# Patient Record
Sex: Female | Born: 1955 | Race: White | Hispanic: No | Marital: Married | Smoking: Never smoker
Health system: Southern US, Community
[De-identification: ages and names within clinical notes are randomized; demographics above are authoritative.]

## PROBLEM LIST (undated history)

## (undated) DIAGNOSIS — E785 Hyperlipidemia, unspecified: Secondary | ICD-10-CM

## (undated) DIAGNOSIS — I10 Essential (primary) hypertension: Secondary | ICD-10-CM

## (undated) HISTORY — PX: ABDOMINAL HYSTERECTOMY: SHX81

## (undated) HISTORY — PX: TONSILLECTOMY: SUR1361

## (undated) HISTORY — DX: Essential (primary) hypertension: I10

## (undated) HISTORY — DX: Hyperlipidemia, unspecified: E78.5

---

## 2013-03-26 ENCOUNTER — Ambulatory Visit (INDEPENDENT_AMBULATORY_CARE_PROVIDER_SITE_OTHER): Payer: BC Managed Care – PPO | Admitting: Family Medicine

## 2013-03-26 ENCOUNTER — Telehealth: Payer: Self-pay | Admitting: *Deleted

## 2013-03-26 ENCOUNTER — Encounter: Payer: Self-pay | Admitting: Family Medicine

## 2013-03-26 VITALS — BP 202/112 | HR 77 | Ht 63.75 in | Wt 145.0 lb

## 2013-03-26 DIAGNOSIS — H519 Unspecified disorder of binocular movement: Secondary | ICD-10-CM

## 2013-03-26 DIAGNOSIS — E785 Hyperlipidemia, unspecified: Secondary | ICD-10-CM

## 2013-03-26 DIAGNOSIS — I10 Essential (primary) hypertension: Secondary | ICD-10-CM | POA: Insufficient documentation

## 2013-03-26 DIAGNOSIS — H05829 Myopathy of extraocular muscles, unspecified orbit: Secondary | ICD-10-CM | POA: Insufficient documentation

## 2013-03-26 DIAGNOSIS — H05821 Myopathy of extraocular muscles, right orbit: Secondary | ICD-10-CM

## 2013-03-26 MED ORDER — LOSARTAN POTASSIUM 50 MG PO TABS: 50.0000 mg | ORAL_TABLET | Freq: Every day | ORAL | Status: DC

## 2013-03-26 NOTE — Telephone Encounter (Signed)
Pt called and left message stating her chol lab values for June of this year: Triglycerides 98, HDL 63, LDL 170, Total Chol 253

## 2013-03-26 NOTE — Progress Notes (Signed)
CC: Christie Lopez is a 57 y.o. female is here for Establish Care and discuss chol meds   Subjective: HPI:  Pleasant 57 year old here to establish care  Patient reports a history of hyperlipidemia and has been told by her former physician to start cholesterol medication. She is reluctant to do this as she believes her numbers are quite favorable. She would like a second opinion from our clinic. Unfortunately she does not have her numbers with her at this time. She has never been on cholesterol medication before. She tries to stay active no formal exercise routine she does watch what she eats.   Patient reports a history of hypertension: She takes her blood pressures at home and notes it is typically 180/100 average every morning. Interestingly in the afternoon she reports average 1:30 over 80s. She has been on amlodipine which helped control blood pressures but caused intolerable fatigue. She's been on losartan an unknown dose which helped somewhat but she still had stage II values per her report. She is unsure about daily salt intake. She denies stimulant use decongestant use nor caffeine consumption. She rarely drinks alcohol no tobacco use.  She denies recent or remote history of motor or sensory disturbances, chest pain, shortness of breath, headaches.   Review Of Systems Outlined In HPI  Past Medical History  Diagnosis Date  . Hypertension   . Hyperlipidemia      Family History  Problem Relation Age of Onset  . Hyperlipidemia    . Hypertension       History  Substance Use Topics  . Smoking status: Never Smoker   . Smokeless tobacco: Not on file  . Alcohol Use: 1.0 oz/week    2 drink(s) per week     Objective: Filed Vitals:   03/26/13 0836  BP: 202/112  Pulse: 77    General: Alert and Oriented, No Acute Distress HEENT: Pupils equal, round, reactive to light. Conjunctivae clear. Pink inferior turbinates.  Moist mucous membranes, pharynx without inflammation nor lesions.   Neck supple without palpable lymphadenopathy nor abnormal masses. Lungs: Clear to auscultation bilaterally, no wheezing/ronchi/rales.  Comfortable work of breathing. Good air movement. Cardiac: Regular rate and rhythm. Normal S1/S2.  No murmurs, rubs, nor gallops.   Neuro: CN II-XII grossly intact, full strength/rom of all four extremities, C5/L4/S1 DTRs 2/4 bilaterally, gait normal, rapid alternating movements normal, heel-shin test normal, Rhomberg normal. Extremities: No peripheral edema.  Strong peripheral pulses.  Mental Status: No depression, anxiety, nor agitation. Skin: Warm and dry.  Assessment & Plan: Jamilyn was seen today for establish care and discuss chol meds.  Diagnoses and associated orders for this visit:  Essential hypertension, benign - losartan (COZAAR) 50 MG tablet; Take 1 tablet (50 mg total) by mouth daily.  Hyperlipidemia  Extraocular muscle weakness, right  Other Orders - Fluticasone-Salmeterol (ADVAIR DISKUS IN); Inhale into the lungs. - DiphenhydrAMINE HCl (BENADRYL ALLERGY PO); Take by mouth.    Essential hypertension: Uncontrolled chronic condition we will restart losartan I've asked her to start this every evening she has agreed to keep a blood pressure diary taken twice a day starting today and through the beginning of losartan regimen. I have discussed that she may need multiple agents but we will slowly lower her blood pressure. Hyperlipidemia: She is agreed to provide [place] with recent lipid panel values from June for determination of her goal LDL We will hold off on blood work today as it sounds like she has had a recent TSH CBC and CMP, were  waiting outside records  Return in about 2 weeks (around 04/09/2013).

## 2013-03-27 ENCOUNTER — Encounter: Payer: Self-pay | Admitting: Family Medicine

## 2013-03-27 DIAGNOSIS — E785 Hyperlipidemia, unspecified: Secondary | ICD-10-CM | POA: Insufficient documentation

## 2013-03-27 DIAGNOSIS — M8580 Other specified disorders of bone density and structure, unspecified site: Secondary | ICD-10-CM | POA: Insufficient documentation

## 2013-03-27 DIAGNOSIS — K5790 Diverticulosis of intestine, part unspecified, without perforation or abscess without bleeding: Secondary | ICD-10-CM | POA: Insufficient documentation

## 2013-03-27 DIAGNOSIS — Z8709 Personal history of other diseases of the respiratory system: Secondary | ICD-10-CM | POA: Insufficient documentation

## 2013-03-27 MED ORDER — ATORVASTATIN CALCIUM 20 MG PO TABS: 20.0000 mg | ORAL_TABLET | Freq: Every day | ORAL | Status: DC

## 2013-03-27 NOTE — Telephone Encounter (Signed)
Sue Lush, Will you please let Christie Lopez know that based on her numbers it's estimated that she has a 6% risk of having a heart attack in the next ten years.  Current guidelines would put her goal LDL choleserol at less than 160.  With her's being just above that, I would encourage her to start a cholesterol medication such as Lipitor.  I will send this into her pharmacy if she's interested in starting it. It would be wise to recheck this in 3 months.

## 2013-03-27 NOTE — Telephone Encounter (Signed)
Pt.notified

## 2013-04-01 ENCOUNTER — Telehealth: Payer: Self-pay | Admitting: *Deleted

## 2013-04-01 DIAGNOSIS — E785 Hyperlipidemia, unspecified: Secondary | ICD-10-CM

## 2013-04-01 NOTE — Telephone Encounter (Signed)
Pt left a message stating she decided not to go on chol med

## 2013-04-06 ENCOUNTER — Encounter: Payer: Self-pay | Admitting: Family Medicine

## 2013-04-07 ENCOUNTER — Telehealth: Payer: Self-pay

## 2013-04-16 ENCOUNTER — Ambulatory Visit: Payer: BC Managed Care – PPO | Admitting: Family Medicine

## 2013-07-02 ENCOUNTER — Other Ambulatory Visit (HOSPITAL_COMMUNITY)
Admission: RE | Admit: 2013-07-02 | Discharge: 2013-07-02 | Disposition: A | Discharge status: Home / Self Care | Payer: BC Managed Care – PPO | Source: Ambulatory Visit | Attending: Family Medicine | Admitting: Family Medicine

## 2013-07-02 ENCOUNTER — Ambulatory Visit (INDEPENDENT_AMBULATORY_CARE_PROVIDER_SITE_OTHER): Payer: BC Managed Care – PPO | Admitting: Family Medicine

## 2013-07-02 ENCOUNTER — Encounter: Payer: Self-pay | Admitting: Family Medicine

## 2013-07-02 VITALS — BP 183/94 | HR 69 | Ht 63.75 in | Wt 144.0 lb

## 2013-07-02 DIAGNOSIS — Z0000 Encounter for general adult medical examination without abnormal findings: Secondary | ICD-10-CM

## 2013-07-02 DIAGNOSIS — M899 Disorder of bone, unspecified: Secondary | ICD-10-CM

## 2013-07-02 DIAGNOSIS — I10 Essential (primary) hypertension: Secondary | ICD-10-CM

## 2013-07-02 DIAGNOSIS — Z01419 Encounter for gynecological examination (general) (routine) without abnormal findings: Secondary | ICD-10-CM | POA: Insufficient documentation

## 2013-07-02 DIAGNOSIS — M8580 Other specified disorders of bone density and structure, unspecified site: Secondary | ICD-10-CM

## 2013-07-02 DIAGNOSIS — Z13220 Encounter for screening for lipoid disorders: Secondary | ICD-10-CM

## 2013-07-02 DIAGNOSIS — Z1151 Encounter for screening for human papillomavirus (HPV): Secondary | ICD-10-CM | POA: Insufficient documentation

## 2013-07-02 DIAGNOSIS — Z131 Encounter for screening for diabetes mellitus: Secondary | ICD-10-CM

## 2013-07-02 LAB — BASIC METABOLIC PANEL WITH GFR
Calcium: 10.1 mg/dL (ref 8.4–10.5)
GFR, Est African American: >89 mL/min
Glucose, Bld: 81 mg/dL (ref 70–99)
Sodium: 137 mEq/L (ref 135–145)

## 2013-07-02 LAB — LIPID PANEL
HDL: 60 mg/dL (ref >39)
LDL Cholesterol: 156 mg/dL — ABNORMAL HIGH (ref 0–99)
Total CHOL/HDL Ratio: 4 Ratio
VLDL: 23 mg/dL (ref 0–40)

## 2013-07-02 MED ORDER — LOSARTAN POTASSIUM 100 MG PO TABS: 100.0000 mg | ORAL_TABLET | Freq: Every day | ORAL | Status: DC

## 2013-07-02 NOTE — Progress Notes (Signed)
CC: Christie Lopez is a 57 y.o. female is here for Annual Exam   Subjective: HPI:  Colonoscopy: due 2017 Papsmear: Hysterectomy for prolapse, has never had abnormal Pap smear most recently one year ago she is unsure whether or not she has a cervix Mammogram: Had in jan 2014  DEXA: 06/2011 osteopenia will repeat  Influenza Vaccine: declined Pneumovax: denied despite asthma Td/Tdap: UTD Zoster: Start 57 yo  No acute complaints today she brings in a blood pressure diary showing 90% of the time blood pressures in normotensive or prehypertensive state.   Over the past 2 weeks have you been bothered by: - Little interest or pleasure in doing things: No - Feeling down depressed or hopeless: No  No alcohol, tobacco or recreational drug use. No formal exercise routine. Eats a varied diet with fruits and veggies  Review of Systems - General ROS: negative for - chills, fever, night sweats, weight gain or weight loss Ophthalmic ROS: negative for - decreased vision Psychological ROS: negative for - anxiety or depression ENT ROS: negative for - hearing change, nasal congestion, tinnitus or allergies Hematological and Lymphatic ROS: negative for - bleeding problems, bruising or swollen lymph nodes Breast ROS: negative Respiratory ROS: no cough, shortness of breath, or wheezing Cardiovascular ROS: no chest pain or dyspnea on exertion Gastrointestinal ROS: no abdominal pain, change in bowel habits, or black or bloody stools Genito-Urinary ROS: negative for - genital discharge, genital ulcers, incontinence or abnormal bleeding from genitals Musculoskeletal ROS: negative for - joint pain or muscle pain Neurological ROS: negative for - headaches or memory loss Dermatological ROS: negative for lumps, mole changes, rash and skin lesion changes  Past Medical History  Diagnosis Date  . Hypertension   . Hyperlipidemia      Family History  Problem Relation Age of Onset  . Hyperlipidemia    .  Hypertension       History  Substance Use Topics  . Smoking status: Never Smoker   . Smokeless tobacco: Not on file  . Alcohol Use: 1.0 oz/week    2 drink(s) per week     Objective: Filed Vitals:   07/02/13 0832  BP: 183/94  Pulse: 69    General: No Acute Distress HEENT: Atraumatic, normocephalic, conjunctivae normal without scleral icterus.  No nasal discharge, hearing grossly intact, TMs with good landmarks bilaterally with no middle ear abnormalities, posterior pharynx clear without oral lesions. Neck: Supple, trachea midline, no cervical nor supraclavicular adenopathy. Pulmonary: Clear to auscultation bilaterally without wheezing, rhonchi, nor rales. Cardiac: Regular rate and rhythm.  No murmurs, rubs, nor gallops. No peripheral edema.  2+ peripheral pulses bilaterally. Abdomen: Bowel sounds normal.  No masses.  Non-tender without rebound.  Negative Murphy's sign. GU: *Labia majora & minora without lesions.  Distal urethra unremarkable.  Vaginal wall integrity preserved without mucosal lesions.  Cervix is absent vaginal cuff unremarkable.  No adnexal tenderness or masses.  Appropriate uterine size for age. MSK: Grossly intact, no signs of weakness.  Full strength throughout upper and lower extremities.  Full ROM in upper and lower extremities.  No midline spinal tenderness. Neuro: Gait unremarkable, CN II-XII grossly intact.  C5-C6 Reflex 2/4 Bilaterally, L4 Reflex 2/4 Bilaterally.  Cerebellar function intact. Skin: No rashes. Psych: Alert and oriented to person/place/time.  Thought process normal. No anxiety/depression.   Assessment & Plan: [name] was seen today for annual exam.  Diagnoses and associated orders for this visit:  Annual physical exam - Cytology - PAP  Screening, lipid - Lipid  panel  Diabetes mellitus screening - BASIC METABOLIC PANEL WITH GFR  Osteopenia - DG Bone Density; Future  Essential hypertension, benign - losartan (COZAAR) 100 MG tablet;  Take 1 tablet (100 mg total) by mouth daily.    Healthy lifestyle interventions including but limited to regular exercise, a healthy low fat diet, moderation of salt intake, the dangers of tobacco/alcohol/recreational drug use, nutrition supplementation, and accident avoidance were discussed with the patient and a handout was provided for future reference. Obtained Pap, increasing losartan continued blood pressure diary at home. Due for screening lipid and diabetic screening.  Return in about 4 weeks (around 07/30/2013) for BP Check/Review.

## 2013-07-02 NOTE — Patient Instructions (Signed)
Dr. Temple Sporer's General Advice Following Your Complete Physical Exam  The Benefits of Regular Exercise: Unless you suffer from an uncontrolled cardiovascular condition, studies strongly suggest that regular exercise and physical activity will add to both the quality and length of your life.  The World Health Organization recommends 150 minutes of moderate intensity aerobic activity every week.  This is best split over 3-4 days a week, and can be as simple as a brisk walk for just over 35 minutes "most days of the week".  This type of exercise has been shown to lower LDL-Cholesterol, lower average blood sugars, lower blood pressure, lower cardiovascular disease risk, improve memory, and increase one's overall sense of wellbeing.  The addition of anaerobic (or "strength training") exercises offers additional benefits including but not limited to increased metabolism, prevention of osteoporosis, and improved overall cholesterol levels.  How Can I Strive For A Low-Fat Diet?: Current guidelines recommend that 25-35 percent of your daily energy (food) intake should come from fats.  One might ask how can this be achieved without having to dissect each meal on a daily basis?  Switch to skim or 1% milk instead of whole milk.  Focus on lean meats such as ground turkey, fresh fish, baked chicken, and lean cuts of beef as your source of dietary protein.  Consume less than 300mg/day of dietary cholesterol.  Limit trans fatty acid consumption primarily by limiting synthetic trans fats such as partially hydrogenated oils (Ex: fried fast foods).  Focus efforts on reducing your intake of "solid" fats (Ex: Butter).  Substitute olive or vegetable oil for solid fats where possible.  Moderation of Salt Intake: Provided you don't carry a diagnosis of congestive heart failure nor renal failure, I recommend a daily allowance of no more than 2300 mg of salt (sodium).  Keeping under this daily goal is associated with a  decreased risk of cardiovascular events, creeping above it can lead to elevated blood pressures and increases your risk of cardiovascular events.  Milligrams (mg) of salt is listed on all nutrition labels, and your daily intake can add up faster than you think.  Most canned and frozen dinners can pack in over half your daily salt allowance in one meal.    Lifestyle Health Risks: Certain lifestyle choices carry specific health risks.  As you may already know, tobacco use has been associated with increasing one's risk of cardiovascular disease, pulmonary disease, numerous cancers, among many other issues.  What you may not know is that there are medications and nicotine replacement strategies that can more than double your chances of successfully quitting.  I would be thrilled to help manage your quitting strategy if you currently use tobacco products.  When it comes to alcohol use, I've yet to find an "ideal" daily allowance.  Provided an individual does not have a medical condition that is exacerbated by alcohol consumption, general guidelines determine "safe drinking" as no more than two standard drinks for a man or no more than one standard drink for a female per day.  However, much debate still exists on whether any amount of alcohol consumption is technically "safe".  My general advice, keep alcohol consumption to a minimum for general health promotion.  If you or others believe that alcohol, tobacco, or recreational drug use is interfering with your life, I would be happy to provide confidential counseling regarding treatment options.  General "Over The Counter" Nutrition Advice: Postmenopausal women should aim for a daily calcium intake of 1200 mg, however a significant   portion of this might already be provided by diets including milk, yogurt, cheese, and other dairy products.  Vitamin D has been shown to help preserve bone density, prevent fatigue, and has even been shown to help reduce falls in the  elderly.  Ensuring a daily intake of 800 Units of Vitamin D is a good place to start to enjoy the above benefits, we can easily check your Vitamin D level to see if you'd potentially benefit from supplementation beyond 800 Units a day.  Folic Acid intake should be of particular concern to women of childbearing age.  Daily consumption of 400-800 mcg of Folic Acid is recommended to minimize the chance of spinal cord defects in a fetus should pregnancy occur.    For many adults, accidents still remain one of the most common culprits when it comes to cause of death.  Some of the simplest but most effective preventitive habits you can adopt include regular seatbelt use, proper helmet use, securing firearms, and regularly testing your smoke and carbon monoxide detectors.  Christie Lopez B. [facility] DO Med [facility] [address], [address] [place], [place] [address] Phone: [phone]  Breast Self-Examination You should begin examining your breasts at age 57 even though the risk for breast cancer is low in this age group. It is important to become familiar with how your breasts look and feel. This is true for pregnant women, nursing mothers, women in menopause and women who have breast implants.  Women should examine their breasts once a month to look for changes and lumps. By doing monthly breast exams, you get to know how your breasts feel and how they can change from month to month. This allows you to pick up changes early. It can also offer you some reassurance that your breast health is good. This exam only takes minutes. Most breast lumps are not caused by cancer. If you find a lump, a special x-ray called a mammogram, or other tests may be needed to determine what is wrong.  Some of the signs that a breast lump is caused by cancer include:  Dimpling of the skin or changes in the shape of the breast or nipple.   A dark-colored or bloody discharge from the nipple.   Swollen lymph glands around the  breast or in the armpit.   Redness of the breast or nipple.   Scaly nipple or skin on the breast.   Pain or swelling of the breast.  SELF-EXAM There are a few points to follow when doing a thorough breast exam. The best time to examine your breasts is 5 to 7 days after the menstrual period is over. During menstruation, the breasts are lumpier, and it may be more difficult to pick up changes. If you do not menstruate, have reached menopause or had a hysterectomy, examine your breasts the first day of every month. After three to four months, you will become more familiar with the variations of your breasts and more comfortable with the exam.  Perform your breast exam monthly. Keep a written record with breast changes or normal findings for each breast. This makes it easier to be sure of changes and to not solely depend on memory for size, tenderness, or location. Try to do the exam at the same time each month, and write down where you are in your menstrual cycle if you are still menstruating.   Look at your breasts. Stand in front of a mirror with your hands clasped behind your head. Tighten  your chest muscles and look for asymmetry. This means a difference in shape or contour from one breast to the other, such as puckers, dips or bumps. Look also for skin changes.   Lean forward with your hands on your hips. Again, look for symmetry and skin changes.   While showering, soap the breasts, and carefully feel the breasts with fingertips while holding the arm (on the side of the breast being examined) over the head. Do this with each breast carefully feeling for lumps or changes. Typically, a circular motion with moderate fingertip pressure should be used.   Repeat this exam while lying on your back, again with your arm behind your head and a pillow under your shoulders. Again, use your fingertips to examine both breasts, feeling for lumps and thickening. Begin at 1 o'clock and go clockwise around the  whole breast.   At the end of your exam, gently squeeze each nipple to see if there is any drainage. Look for nipple changes, dimpling or redness.   Lastly, examine the upper chest and clavicle areas and in your armpits.  It is not necessary to become alarmed if you find a breast lump. Most of them are not cancerous. However, it is necessary to see your caregiver if a lump is found in order to have it looked at. Document Released: 10/04/2004 Document Revised: 05/09/2011 Document Reviewed: 12/14/2008 [facility] Patient Information 2012 [place], [place].

## 2013-07-03 ENCOUNTER — Encounter: Payer: Self-pay | Admitting: Family Medicine

## 2013-07-16 ENCOUNTER — Other Ambulatory Visit: Payer: Self-pay

## 2013-08-12 ENCOUNTER — Ambulatory Visit (INDEPENDENT_AMBULATORY_CARE_PROVIDER_SITE_OTHER): Payer: BC Managed Care – PPO | Admitting: Family Medicine

## 2013-08-12 ENCOUNTER — Encounter: Payer: Self-pay | Admitting: Family Medicine

## 2013-08-12 VITALS — BP 161/94 | HR 67 | Wt 148.0 lb

## 2013-08-12 DIAGNOSIS — I10 Essential (primary) hypertension: Secondary | ICD-10-CM

## 2013-08-12 DIAGNOSIS — J45909 Unspecified asthma, uncomplicated: Secondary | ICD-10-CM

## 2013-08-12 MED ORDER — FLUTICASONE-SALMETEROL 100-50 MCG/DOSE IN AEPB: 1.0000 | INHALATION_SPRAY | Freq: Two times a day (BID) | RESPIRATORY_TRACT | Status: DC

## 2013-08-12 MED ORDER — LOSARTAN POTASSIUM-HCTZ 100-25 MG PO TABS: 1.0000 | ORAL_TABLET | Freq: Every day | ORAL | Status: DC

## 2013-08-12 NOTE — Progress Notes (Signed)
CC: Christie Lopez is a 57 y.o. female is here for Hypertension   Subjective: HPI:  Followup hypertension: Has been on losartan 100 mg taking daily basis for the past month. Blood pressures at home are in the stage I hypertensive region during the a.m. hours and are normotensive or prehypertensive predictably in the evening hours. This happens despite taking medication in the morning or afternoon. She denies known side effects, denies chest pain, shortness of breath, orthopnea, nor peripheral edema.  Followup asthma: Patient reports she is about to run out of the old prescription of Advair 100/50 she is using this on a daily basis without any need for rescue inhaler. Denies cough, shortness of breath or wheezing.  Review Of Systems Outlined In HPI  Past Medical History  Diagnosis Date  . Hypertension   . Hyperlipidemia      Family History  Problem Relation Age of Onset  . Hyperlipidemia    . Hypertension       History  Substance Use Topics  . Smoking status: Never Smoker   . Smokeless tobacco: Not on file  . Alcohol Use: 1.0 oz/week    2 drink(s) per week     Objective: Filed Vitals:   08/12/13 1430  BP: 161/94  Pulse: 67    General: Alert and Oriented, No Acute Distress HEENT: Pupils equal, round, reactive to light. Conjunctivae clear.  Moist mucous membranes pharynx unremarkable Lungs: Clear to auscultation bilaterally, no wheezing/ronchi/rales.  Comfortable work of breathing. Good air movement. Cardiac: Regular rate and rhythm. Normal S1/S2.  No murmurs, rubs, nor gallops.   Extremities: No peripheral edema.  Strong peripheral pulses.  Mental Status: No depression, anxiety, nor agitation. Skin: Warm and dry.  Assessment & Plan: Christie Lopez was seen today for hypertension.  Diagnoses and associated orders for this visit:  Asthma, chronic - Fluticasone-Salmeterol (ADVAIR DISKUS) 100-50 MCG/DOSE AEPB; Inhale 1 puff into the lungs 2 (two) times daily.  Essential  hypertension, benign - losartan-hydrochlorothiazide (HYZAAR) 100-25 MG per tablet; Take 1 tablet by mouth daily.    Asthma: Controlled, continue Advair Essential hypertension: Uncontrolled chronic condition adding hydrochlorothiazide. Keep home blood pressure diary and dropped off in the next 2-3 weeks to determine if she needs close followup face-to-face  Return if symptoms worsen or fail to improve.

## 2013-08-19 ENCOUNTER — Encounter: Payer: Self-pay | Admitting: Family Medicine

## 2013-08-20 ENCOUNTER — Encounter: Payer: Self-pay | Admitting: Family Medicine

## 2013-09-16 ENCOUNTER — Encounter: Payer: Self-pay | Admitting: Family Medicine

## 2013-09-21 ENCOUNTER — Telehealth: Payer: Self-pay | Admitting: Family Medicine

## 2013-09-21 MED ORDER — LOSARTAN POTASSIUM-HCTZ 50-12.5 MG PO TABS: 1.0000 | ORAL_TABLET | Freq: Every day | ORAL | Status: DC

## 2013-09-21 NOTE — Telephone Encounter (Signed)
Refill req 

## 2013-11-12 ENCOUNTER — Telehealth: Payer: Self-pay | Admitting: Family Medicine

## 2013-11-12 NOTE — Telephone Encounter (Signed)
Sue Lushndrea, Will you please let patient know that I'm not sure if [facility] let her know but her late Feb mammogram did not show any abnormality therefore only annual routine mammograms are recommended.

## 2013-11-12 NOTE — Telephone Encounter (Signed)
Pt.notified

## 2014-01-06 ENCOUNTER — Encounter: Payer: Self-pay | Admitting: Family Medicine

## 2014-04-07 ENCOUNTER — Other Ambulatory Visit: Payer: Self-pay | Admitting: Family Medicine

## 2014-07-14 ENCOUNTER — Ambulatory Visit (INDEPENDENT_AMBULATORY_CARE_PROVIDER_SITE_OTHER): Payer: BC Managed Care – PPO | Admitting: Family Medicine

## 2014-07-14 ENCOUNTER — Encounter: Payer: Self-pay | Admitting: Family Medicine

## 2014-07-14 VITALS — BP 130/85 | HR 61 | Ht 63.75 in | Wt 145.0 lb

## 2014-07-14 DIAGNOSIS — E785 Hyperlipidemia, unspecified: Secondary | ICD-10-CM | POA: Diagnosis not present

## 2014-07-14 DIAGNOSIS — Z0000 Encounter for general adult medical examination without abnormal findings: Secondary | ICD-10-CM | POA: Diagnosis not present

## 2014-07-14 DIAGNOSIS — M8580 Other specified disorders of bone density and structure, unspecified site: Secondary | ICD-10-CM | POA: Diagnosis not present

## 2014-07-14 DIAGNOSIS — I10 Essential (primary) hypertension: Secondary | ICD-10-CM

## 2014-07-14 DIAGNOSIS — J4530 Mild persistent asthma, uncomplicated: Secondary | ICD-10-CM

## 2014-07-14 LAB — LIPID PANEL
Cholesterol: 266 mg/dL — ABNORMAL HIGH (ref 0–200)
HDL: 62 mg/dL (ref >39)
LDL Cholesterol: 176 mg/dL — ABNORMAL HIGH (ref 0–99)
Total CHOL/HDL Ratio: 4.3 Ratio
Triglycerides: 138 mg/dL (ref <150)
VLDL: 28 mg/dL (ref 0–40)

## 2014-07-14 LAB — COMPLETE METABOLIC PANEL WITH GFR
ALBUMIN: 4.4 g/dL (ref 3.5–5.2)
ALT: 11 U/L (ref 0–35)
AST: 19 U/L (ref 0–37)
Alkaline Phosphatase: 60 U/L (ref 39–117)
BUN: 16 mg/dL (ref 6–23)
CO2: 26 meq/L (ref 19–32)
Calcium: 9.9 mg/dL (ref 8.4–10.5)
Chloride: 102 mEq/L (ref 96–112)
Creat: 0.93 mg/dL (ref 0.50–1.10)
GFR, EST AFRICAN AMERICAN: 78 mL/min
GFR, EST NON AFRICAN AMERICAN: 68 mL/min
Glucose, Bld: 86 mg/dL (ref 70–99)
POTASSIUM: 4 meq/L (ref 3.5–5.3)
SODIUM: 139 meq/L (ref 135–145)
TOTAL PROTEIN: 6.9 g/dL (ref 6.0–8.3)
Total Bilirubin: 1.1 mg/dL (ref 0.2–1.2)

## 2014-07-14 MED ORDER — FLUTICASONE-SALMETEROL 100-50 MCG/DOSE IN AEPB: 1.0000 | INHALATION_SPRAY | Freq: Two times a day (BID) | RESPIRATORY_TRACT | Status: DC

## 2014-07-14 MED ORDER — LOSARTAN POTASSIUM-HCTZ 50-12.5 MG PO TABS: ORAL_TABLET | ORAL | Status: DC

## 2014-07-14 NOTE — Progress Notes (Signed)
CC: Christie Lopez is a 58 y.o. female is here for Annual Exam   Subjective: HPI:  Colonoscopy:Due 2017 Papsmear: Negative last year.  Full hysterectomy for benign reasons, no further paps. Mammogram: Normal 10/2013, advised to have repeat February 2016  DEXA: Due for repeat, last 2012,we'll reorder today Influenza Vaccine: declined Pneumovax: declined despite asthma Td/Tdap: Td from 2013 UTD Zoster: (Start 58 yo)  Rare alcohol use no tobacco or recreational drug use  Review of Systems - General ROS: negative for - chills, fever, night sweats, weight gain or weight loss Ophthalmic ROS: negative for - decreased vision Psychological ROS: negative for - anxiety or depression ENT ROS: negative for - hearing change, nasal congestion, tinnitus or allergies Hematological and Lymphatic ROS: negative for - bleeding problems, bruising or swollen lymph nodes Breast ROS: negative Respiratory ROS: no cough, shortness of breath, or wheezing Cardiovascular ROS: no chest pain or dyspnea on exertion Gastrointestinal ROS: no abdominal pain, change in bowel habits, or black or bloody stools Genito-Urinary ROS: negative for - genital discharge, genital ulcers, incontinence or abnormal bleeding from genitals Musculoskeletal ROS: negative for - joint pain or muscle pain Neurological ROS: negative for - headaches or memory loss Dermatological ROS: negative for lumps, mole changes, rash and skin lesion changes  Past Medical History  Diagnosis Date  . Hypertension   . Hyperlipidemia     Past Surgical History  Procedure Laterality Date  . Tonsillectomy    . Abdominal hysterectomy     Family History  Problem Relation Age of Onset  . Hyperlipidemia    . Hypertension      History   Social History  . Marital Status: Married    Spouse Name: N/A    Number of Children: N/A  . Years of Education: N/A   Occupational History  . Not on file.   Social History Main Topics  . Smoking status: Never  Smoker   . Smokeless tobacco: Not on file  . Alcohol Use: 1.0 oz/week    2 drink(s) per week  . Drug Use: No  . Sexual Activity: Yes   Other Topics Concern  . Not on file   Social History Narrative     Objective: BP 130/85 mmHg  Pulse 61  Ht 5' 3.75" (1.619 m)  Wt 145 lb (65.772 kg)  BMI 25.09 kg/m2  General: No Acute Distress HEENT: Atraumatic, normocephalic, conjunctivae normal without scleral icterus.  No nasal discharge, hearing grossly intact, TMs with good landmarks bilaterally with no middle ear abnormalities, posterior pharynx clear without oral lesions. Neck: Supple, trachea midline, no cervical nor supraclavicular adenopathy. Pulmonary: Clear to auscultation bilaterally without wheezing, rhonchi, nor rales. Cardiac: Regular rate and rhythm.  No murmurs, rubs, nor gallops. No peripheral edema.  2+ peripheral pulses bilaterally. Abdomen: Bowel sounds normal.  No masses.  Non-tender without rebound.  Negative Murphy's sign. MSK: Grossly intact, no signs of weakness.  Full strength throughout upper and lower extremities.  Full ROM in upper and lower extremities.  No midline spinal tenderness. Neuro: Gait unremarkable, CN II-XII grossly intact.  C5-C6 Reflex 2/4 Bilaterally, L4 Reflex 2/4 Bilaterally.  Cerebellar function intact. Skin: No rashes. Scattered seborrheic keratoses on the arms and back, noninflamed Psych: Alert and oriented to person/place/time.  Thought process normal. No anxiety/depression.   Assessment & Plan: Christie Lopez was seen today for annual exam.  Diagnoses and associated orders for this visit:  Annual physical exam - COMPLETE METABOLIC PANEL WITH GFR - Lipid panel - DG Bone Density; Future  Essential hypertension, benign  Hyperlipidemia with target LDL less than 160  Osteopenia - DG Bone Density; Future  Asthma, chronic, mild persistent, uncomplicated - Fluticasone-Salmeterol (ADVAIR DISKUS) 100-50 MCG/DOSE AEPB; Inhale 1 puff into the lungs 2  (two) times daily.  Other Orders - losartan-hydrochlorothiazide (HYZAAR) 50-12.5 MG per tablet; TAKE ONE TABLET EVERY DAY    Healthy lifestyle interventions including but not limited to regular exercise, a healthy low fat diet, moderation of salt intake, the dangers of tobacco/alcohol/recreational drug use, nutrition supplementation, and accident avoidance were discussed with the patient and a handout was provided for future reference.  She requests refills on Hyzaar with her blood pressure be normotensive and control, refills on Advair with her asthma controlled.  Return in about 1 year (around 07/15/2015) for annual physicals.

## 2014-09-07 ENCOUNTER — Encounter: Payer: Self-pay | Admitting: Family Medicine

## 2014-09-07 ENCOUNTER — Ambulatory Visit (INDEPENDENT_AMBULATORY_CARE_PROVIDER_SITE_OTHER): Payer: BC Managed Care – PPO | Admitting: Family Medicine

## 2014-09-07 VITALS — BP 150/83 | HR 73 | Temp 98.5°F | Wt 151.0 lb

## 2014-09-07 DIAGNOSIS — A499 Bacterial infection, unspecified: Secondary | ICD-10-CM | POA: Diagnosis not present

## 2014-09-07 DIAGNOSIS — H1089 Other conjunctivitis: Secondary | ICD-10-CM

## 2014-09-07 DIAGNOSIS — H109 Unspecified conjunctivitis: Secondary | ICD-10-CM

## 2014-09-07 MED ORDER — POLYMYXIN B-TRIMETHOPRIM 10000-0.1 UNIT/ML-% OP SOLN: 2.0000 [drp] | OPHTHALMIC | Status: DC

## 2014-09-07 NOTE — Progress Notes (Signed)
CC: Christie Lopez is a 58 y.o. female is here for eye irritation   Subjective: HPI:  Right eye redness and drainage that has been present ever since she awoke this morning. She went to bed last night in her normal state of health with any known ocular complaints. Since the day has progressed she's noticed that the eyelids seem to be more swollen. She describes her discharge as clear tears with occasional film of mucus. No known exposure to anybody with similar illness.  She tells me that her eyelids feel irritated but denies any eye pain. She denies photophobia, change in vision, fevers, nor pain with blinking or movement of the eye. She's never had this before. She had what she thinks was a sinus infection and head cold last week but was recovering over the weekend. Currently denies nasal congestion and ear pain and facial pain or sinus pressure. No interventions for the above as of yet   Review Of Systems Outlined In HPI  Past Medical History  Diagnosis Date  . Hypertension   . Hyperlipidemia     Past Surgical History  Procedure Laterality Date  . Tonsillectomy    . Abdominal hysterectomy     Family History  Problem Relation Age of Onset  . Hyperlipidemia    . Hypertension      History   Social History  . Marital Status: Married    Spouse Name: N/A    Number of Children: N/A  . Years of Education: N/A   Occupational History  . Not on file.   Social History Main Topics  . Smoking status: Never Smoker   . Smokeless tobacco: Not on file  . Alcohol Use: 1.0 oz/week    2 drink(s) per week  . Drug Use: No  . Sexual Activity: Yes   Other Topics Concern  . Not on file   Social History Narrative     Objective: BP 150/83 mmHg  Pulse 73  Temp(Src) 98.5 F (36.9 C) (Oral)  Wt 151 lb (68.493 kg)  General: Alert and Oriented, No Acute Distress HEENT: Pupils equal, round, reactive to light. Left Conjunctivae clear. Moderate conjunctival injection/erythema involving the  majority of the conjunctiva and just barely sparing the border at the limbus. Mild mucoid discharge from the right eye. Anterior chamber of both eyes appear open without debris. No visible foreign body underneath the upper eyelid or lower eyelid on the right.  External ears unremarkable, canals clear with intact TMs with appropriate landmarks.  Middle ear appears open without effusion. Pink inferior turbinates.  Moist mucous membranes, pharynx without inflammation nor lesions.  Neck supple without palpable lymphadenopathy nor abnormal masses. No apparent discomfort with penlight to the eyes Extremities: No peripheral edema.  Strong peripheral pulses.  Mental Status: No depression, anxiety, nor agitation. Skin: Warm and dry.  Assessment & Plan: Christie Lopez was seen today for eye irritation.  Diagnoses and associated orders for this visit:  Bacterial conjunctivitis - trimethoprim-polymyxin b (POLYTRIM) ophthalmic solution; Place 2 drops into both eyes every 4 (four) hours. For seven days.    Discussed it's reassuring that she has 20/20 vision out of her right eye, it is also further reassuring that she is not having any photophobia, fever, nor ocular pain. I discussed that if she develops any of this she would need a emergent ophthalmology referral. For now start Polytrim for suspected bacterial conjunctivitis. Discussed contagious nature of this condition and ways to prevent spread.  Return if symptoms worsen or fail to improve.

## 2014-09-09 ENCOUNTER — Encounter: Payer: Self-pay | Admitting: Family Medicine

## 2014-09-22 ENCOUNTER — Ambulatory Visit (INDEPENDENT_AMBULATORY_CARE_PROVIDER_SITE_OTHER): Payer: BLUE CROSS/BLUE SHIELD

## 2014-09-22 DIAGNOSIS — M859 Disorder of bone density and structure, unspecified: Secondary | ICD-10-CM

## 2014-09-22 DIAGNOSIS — M8580 Other specified disorders of bone density and structure, unspecified site: Secondary | ICD-10-CM

## 2014-09-22 DIAGNOSIS — Z0000 Encounter for general adult medical examination without abnormal findings: Secondary | ICD-10-CM

## 2014-10-20 ENCOUNTER — Other Ambulatory Visit: Payer: Self-pay | Admitting: Family Medicine

## 2014-10-20 DIAGNOSIS — Z9289 Personal history of other medical treatment: Secondary | ICD-10-CM

## 2014-10-27 ENCOUNTER — Ambulatory Visit: Payer: BLUE CROSS/BLUE SHIELD

## 2014-11-03 ENCOUNTER — Ambulatory Visit (INDEPENDENT_AMBULATORY_CARE_PROVIDER_SITE_OTHER): Payer: BLUE CROSS/BLUE SHIELD

## 2014-11-03 DIAGNOSIS — Z9289 Personal history of other medical treatment: Secondary | ICD-10-CM

## 2014-11-03 DIAGNOSIS — R928 Other abnormal and inconclusive findings on diagnostic imaging of breast: Secondary | ICD-10-CM

## 2014-11-08 ENCOUNTER — Encounter: Payer: Self-pay | Admitting: Family Medicine

## 2014-11-08 ENCOUNTER — Other Ambulatory Visit: Payer: Self-pay | Admitting: Family Medicine

## 2014-11-08 DIAGNOSIS — R928 Other abnormal and inconclusive findings on diagnostic imaging of breast: Secondary | ICD-10-CM

## 2014-11-19 ENCOUNTER — Ambulatory Visit
Admission: RE | Admit: 2014-11-19 | Discharge: 2014-11-19 | Disposition: A | Discharge status: Home / Self Care | Payer: BLUE CROSS/BLUE SHIELD | Source: Ambulatory Visit | Attending: Family Medicine | Admitting: Family Medicine

## 2014-11-19 ENCOUNTER — Encounter: Payer: Self-pay | Admitting: Family Medicine

## 2014-11-19 DIAGNOSIS — R928 Other abnormal and inconclusive findings on diagnostic imaging of breast: Secondary | ICD-10-CM

## 2014-11-19 DIAGNOSIS — N63 Unspecified lump in unspecified breast: Secondary | ICD-10-CM | POA: Insufficient documentation

## 2015-02-28 ENCOUNTER — Encounter: Payer: Self-pay | Admitting: Family Medicine

## 2015-03-01 ENCOUNTER — Telehealth: Payer: Self-pay | Admitting: Family Medicine

## 2015-03-01 NOTE — Telephone Encounter (Signed)
Message left on vm 

## 2015-03-01 NOTE — Telephone Encounter (Signed)
Sue Lush, Will you please let patient know that her blood pressure does not look high enough to need to restart her losartan-hctz.  I think she can safely focus on a low sodium diet and exercise most days of the week to try and get her BP down.  Can she please f/u in 2-3 months to go over her success.  (BP after stopping losartan-hctz in pre-hypertensive range.

## 2015-04-15 ENCOUNTER — Ambulatory Visit: Payer: BLUE CROSS/BLUE SHIELD | Admitting: Family Medicine

## 2015-04-22 ENCOUNTER — Encounter: Payer: Self-pay | Admitting: Family Medicine

## 2015-04-22 ENCOUNTER — Ambulatory Visit (INDEPENDENT_AMBULATORY_CARE_PROVIDER_SITE_OTHER): Payer: BC Managed Care – PPO | Admitting: Family Medicine

## 2015-04-22 VITALS — BP 157/96 | HR 70 | Ht 63.75 in | Wt 140.0 lb

## 2015-04-22 DIAGNOSIS — I10 Essential (primary) hypertension: Secondary | ICD-10-CM

## 2015-04-22 DIAGNOSIS — Z8709 Personal history of other diseases of the respiratory system: Secondary | ICD-10-CM

## 2015-04-22 MED ORDER — CHLORTHALIDONE 50 MG PO TABS: 50.0000 mg | ORAL_TABLET | Freq: Every day | ORAL | Status: DC

## 2015-04-22 NOTE — Progress Notes (Signed)
CC: Christie Lopez is a 59 y.o. female is here for Blood Pressure Check   Subjective: HPI:  Follow-up essential hypertension: Currently not taking any medication for blood pressure. She's tried to reduce the sodium in her diet and believes that she's gotten as good as she can get. She's also been able to lose about 5 pounds since I saw her last. She denies chest pain shortness of breath orthopnea nor peripheral edema. She brings in blood pressures today from home from 2 separate machines showing readings 50% of the time in the prehypertensive state and 50% in stage I hypertension.   Follow-up asthma: Denies any shortness of breath wheezing or chest discomfort. Has not had to use albuterol for any months.  Review Of Systems Outlined In HPI  Past Medical History  Diagnosis Date  . Hypertension   . Hyperlipidemia     Past Surgical History  Procedure Laterality Date  . Tonsillectomy    . Abdominal hysterectomy     Family History  Problem Relation Age of Onset  . Hyperlipidemia    . Hypertension      Social History   Social History  . Marital Status: Married    Spouse Name: N/A  . Number of Children: N/A  . Years of Education: N/A   Occupational History  . Not on file.   Social History Main Topics  . Smoking status: Never Smoker   . Smokeless tobacco: Not on file  . Alcohol Use: 1.0 oz/week    2 drink(s) per week  . Drug Use: No  . Sexual Activity: Yes   Other Topics Concern  . Not on file   Social History Narrative     Objective: BP 157/96 mmHg  Pulse 70  Ht 5' 3.75" (1.619 m)  Wt 140 lb (63.504 kg)  BMI 24.23 kg/m2  Vital signs reviewed. General: Alert and Oriented, No Acute Distress HEENT: Pupils equal, round, reactive to light. Conjunctivae clear.  External ears unremarkable.  Moist mucous membranes. Lungs: Clear and comfortable work of breathing, speaking in full sentences without accessory muscle use. Cardiac: Regular rate and rhythm.  Neuro: CN II-XII  grossly intact, gait normal. Extremities: No peripheral edema.  Strong peripheral pulses.  Mental Status: No depression, anxiety, nor agitation. Logical though process. Skin: Warm and dry. Assessment & Plan: Christie Lopez was seen today for blood pressure check.  Diagnoses and all orders for this visit:  Essential hypertension, benign -     chlorthalidone (HYGROTON) 50 MG tablet; Take 1 tablet (50 mg total) by mouth daily.  History of asthma   Essential hypertension: Uncontrolled chronic condition starting chlorthalidone, keep blood pressure readings on a daily basis for the next 2 weeks and return for nurse visit with comparison between our machine and her home cuff. Asthma: Controlled no need for current interventions.   Return in about 4 weeks (around 05/20/2015) for Nurse visit, bring BP cuff and blood pressure readings.

## 2015-05-09 ENCOUNTER — Encounter: Payer: Self-pay | Admitting: Family Medicine

## 2015-05-09 DIAGNOSIS — I10 Essential (primary) hypertension: Secondary | ICD-10-CM

## 2015-05-23 ENCOUNTER — Other Ambulatory Visit: Payer: Self-pay | Admitting: Family Medicine

## 2015-05-23 DIAGNOSIS — R928 Other abnormal and inconclusive findings on diagnostic imaging of breast: Secondary | ICD-10-CM

## 2015-05-31 ENCOUNTER — Encounter: Payer: Self-pay | Admitting: Family Medicine

## 2015-05-31 DIAGNOSIS — I10 Essential (primary) hypertension: Secondary | ICD-10-CM

## 2015-05-31 MED ORDER — CHLORTHALIDONE 50 MG PO TABS: 50.0000 mg | ORAL_TABLET | Freq: Every day | ORAL | Status: DC

## 2015-07-04 ENCOUNTER — Ambulatory Visit
Admission: RE | Admit: 2015-07-04 | Discharge: 2015-07-04 | Disposition: A | Discharge status: Home / Self Care | Payer: BC Managed Care – PPO | Source: Ambulatory Visit | Attending: Family Medicine | Admitting: Family Medicine

## 2015-07-04 DIAGNOSIS — R928 Other abnormal and inconclusive findings on diagnostic imaging of breast: Secondary | ICD-10-CM

## 2015-07-15 ENCOUNTER — Telehealth: Payer: Self-pay

## 2015-07-15 DIAGNOSIS — Z0000 Encounter for general adult medical examination without abnormal findings: Secondary | ICD-10-CM

## 2015-07-15 NOTE — Telephone Encounter (Signed)
Pt will like to get labs done before her visit this upcoming Wednesday.

## 2015-07-15 NOTE — Telephone Encounter (Signed)
Lab slips in your in box, please ask patient to be fasting.

## 2015-07-15 NOTE — Telephone Encounter (Signed)
Pt advised.

## 2015-07-20 ENCOUNTER — Encounter: Payer: Self-pay | Admitting: Family Medicine

## 2015-07-20 ENCOUNTER — Ambulatory Visit (INDEPENDENT_AMBULATORY_CARE_PROVIDER_SITE_OTHER): Payer: BC Managed Care – PPO | Admitting: Family Medicine

## 2015-07-20 VITALS — BP 130/80 | HR 64 | Wt 141.0 lb

## 2015-07-20 DIAGNOSIS — N63 Unspecified lump in unspecified breast: Secondary | ICD-10-CM

## 2015-07-20 DIAGNOSIS — I10 Essential (primary) hypertension: Secondary | ICD-10-CM | POA: Diagnosis not present

## 2015-07-20 DIAGNOSIS — Z0000 Encounter for general adult medical examination without abnormal findings: Secondary | ICD-10-CM

## 2015-07-20 LAB — CBC
HEMATOCRIT: 37 % (ref 36.0–46.0)
HEMOGLOBIN: 13.1 g/dL (ref 12.0–15.0)
MCH: 29.8 pg (ref 26.0–34.0)
MCHC: 35.4 g/dL (ref 30.0–36.0)
MCV: 84.1 fL (ref 78.0–100.0)
MPV: 9.7 fL (ref 8.6–12.4)
Platelets: 239 10*3/uL (ref 150–400)
RBC: 4.4 MIL/uL (ref 3.87–5.11)
RDW: 13.6 % (ref 11.5–15.5)
WBC: 4.4 10*3/uL (ref 4.0–10.5)

## 2015-07-20 MED ORDER — CHLORTHALIDONE 50 MG PO TABS: 50.0000 mg | ORAL_TABLET | Freq: Every day | ORAL | Status: DC

## 2015-07-20 NOTE — Progress Notes (Signed)
CC: Christie Lopez is a 59 y.o. female is here for Annual Exam   Subjective: HPI:  Colonoscopy: Normal Sept 2016, repeat 2026 Papsmear: Not indicated Mammogram:Due Feb 2017  DEXA: Due one year  Influenza Vaccine:  Pneumovax: no current indication Td/Tdap: UTD from 2013 Zoster: (Start 59 yo)   Requesting complete physical exam no acute complaints  Review of Systems - General ROS: negative for - chills, fever, night sweats, weight gain or weight loss Ophthalmic ROS: negative for - decreased vision Psychological ROS: negative for - anxiety or depression ENT ROS: negative for - hearing change, nasal congestion, tinnitus or allergies Hematological and Lymphatic ROS: negative for - bleeding problems, bruising or swollen lymph nodes Breast ROS: negative Respiratory ROS: no cough, shortness of breath, or wheezing Cardiovascular ROS: no chest pain or dyspnea on exertion Gastrointestinal ROS: no abdominal pain, change in bowel habits, or black or bloody stools Genito-Urinary ROS: negative for - genital discharge, genital ulcers, incontinence or abnormal bleeding from genitals Musculoskeletal ROS: negative for - joint pain or muscle pain Neurological ROS: negative for - headaches or memory loss Dermatological ROS: negative for lumps, mole changes, rash and skin lesion changes  Past Medical History  Diagnosis Date  . Hypertension   . Hyperlipidemia     Past Surgical History  Procedure Laterality Date  . Tonsillectomy    . Abdominal hysterectomy     Family History  Problem Relation Age of Onset  . Hyperlipidemia    . Hypertension      Social History   Social History  . Marital Status: Married    Spouse Name: N/A  . Number of Children: N/A  . Years of Education: N/A   Occupational History  . Not on file.   Social History Main Topics  . Smoking status: Never Smoker   . Smokeless tobacco: Not on file  . Alcohol Use: 1.0 oz/week    2 drink(s) per week  . Drug Use: No  .  Sexual Activity: Yes   Other Topics Concern  . Not on file   Social History Narrative     Objective: BP 130/80 mmHg  Pulse 64  Wt 141 lb (63.957 kg)  General: No Acute Distress HEENT: Atraumatic, normocephalic, conjunctivae normal without scleral icterus.  No nasal discharge, hearing grossly intact, TMs with good landmarks bilaterally with no middle ear abnormalities, posterior pharynx clear without oral lesions. Neck: Supple, trachea midline, no cervical nor supraclavicular adenopathy. Pulmonary: Clear to auscultation bilaterally without wheezing, rhonchi, nor rales. Cardiac: Regular rate and rhythm.  No murmurs, rubs, nor gallops. No peripheral edema.  2+ peripheral pulses bilaterally. Abdomen: Bowel sounds normal.  No masses.  Non-tender without rebound.  Negative Murphy's sign. MSK: Grossly intact, no signs of weakness.  Full strength throughout upper and lower extremities.  Full ROM in upper and lower extremities.  No midline spinal tenderness. Neuro: Gait unremarkable, CN II-XII grossly intact.other than looking to the left and down with her left eye  C5-C6 Reflex 2/4 Bilaterally, L4 Reflex 2/4 Bilaterally.  Cerebellar function intact. Skin: No rashes. Psych: Alert and oriented to person/place/time.  Thought process normal. No anxiety/depression.   Assessment & Plan: [name] was seen today for annual exam.  Diagnoses and all orders for this visit:  Breast mass  Annual physical exam  Essential hypertension, benign -     chlorthalidone (HYGROTON) 50 MG tablet; Take 1 tablet (50 mg total) by mouth daily.   Healthy lifestyle interventions including but not limited to regular exercise, a  healthy low fat diet, moderation of salt intake, the dangers of tobacco/alcohol/recreational drug use, nutrition supplementation, and accident avoidance were discussed with the patient and a handout was provided for future reference.  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  Return in about 6 months  (around 01/17/2016) for Blood Pressure Follow Up, Call radiology in Feburary to schedule a "Screening Mammogram".

## 2015-07-20 NOTE — Patient Instructions (Signed)
Dr. Tran Arzuaga's General Advice Following Your Complete Physical Exam  The Benefits of Regular Exercise: Unless you suffer from an uncontrolled cardiovascular condition, studies strongly suggest that regular exercise and physical activity will add to both the quality and length of your life.  The World Health Organization recommends 150 minutes of moderate intensity aerobic activity every week.  This is best split over 3-4 days a week, and can be as simple as a brisk walk for just over 35 minutes "most days of the week".  This type of exercise has been shown to lower LDL-Cholesterol, lower average blood sugars, lower blood pressure, lower cardiovascular disease risk, improve memory, and increase one's overall sense of wellbeing.  The addition of anaerobic (or "strength training") exercises offers additional benefits including but not limited to increased metabolism, prevention of osteoporosis, and improved overall cholesterol levels.  How Can I Strive For A Low-Fat Diet?: Current guidelines recommend that 25-35 percent of your daily energy (food) intake should come from fats.  One might ask how can this be achieved without having to dissect each meal on a daily basis?  Switch to skim or 1% milk instead of whole milk.  Focus on lean meats such as ground turkey, fresh fish, baked chicken, and lean cuts of beef as your source of dietary protein.  Limit saturated fat consumption to less than 10% of your daily caloric intake.  Limit trans fatty acid consumption primarily by limiting synthetic trans fats such as partially hydrogenated oils (Ex: fried fast foods).  Substitute olive or vegetable oil for solid fats where possible.  Moderation of Salt Intake: Provided you don't carry a diagnosis of congestive heart failure nor renal failure, I recommend a daily allowance of no more than 2300 mg of salt (sodium).  Keeping under this daily goal is associated with a decreased risk of cardiovascular events, creeping  above it can lead to elevated blood pressures and increases your risk of cardiovascular events.  Milligrams (mg) of salt is listed on all nutrition labels, and your daily intake can add up faster than you think.  Most canned and frozen dinners can pack in over half your daily salt allowance in one meal.    Lifestyle Health Risks: Certain lifestyle choices carry specific health risks.  As you may already know, tobacco use has been associated with increasing one's risk of cardiovascular disease, pulmonary disease, numerous cancers, among many other issues.  What you may not know is that there are medications and nicotine replacement strategies that can more than double your chances of successfully quitting.  I would be thrilled to help manage your quitting strategy if you currently use tobacco products.  When it comes to alcohol use, I've yet to find an "ideal" daily allowance.  Provided an individual does not have a medical condition that is exacerbated by alcohol consumption, general guidelines determine "safe drinking" as no more than two standard drinks for a man or no more than one standard drink for a female per day.  However, much debate still exists on whether any amount of alcohol consumption is technically "safe".  My general advice, keep alcohol consumption to a minimum for general health promotion.  If you or others believe that alcohol, tobacco, or recreational drug use is interfering with your life, I would be happy to provide confidential counseling regarding treatment options.  General "Over The Counter" Nutrition Advice: Postmenopausal women should aim for a daily calcium intake of 1200 mg, however a significant portion of this might already be   provided by diets including milk, yogurt, cheese, and other dairy products.  Vitamin D has been shown to help preserve bone density, prevent fatigue, and has even been shown to help reduce falls in the elderly.  Ensuring a daily intake of 800 Units of  Vitamin D is a good place to start to enjoy the above benefits, we can easily check your Vitamin D level to see if you'd potentially benefit from supplementation beyond 800 Units a day.  Folic Acid intake should be of particular concern to women of childbearing age.  Daily consumption of 400-800 mcg of Folic Acid is recommended to minimize the chance of spinal cord defects in a fetus should pregnancy occur.    For many adults, accidents still remain one of the most common culprits when it comes to cause of death.  Some of the simplest but most effective preventitive habits you can adopt include regular seatbelt use, proper helmet use, securing firearms, and regularly testing your smoke and carbon monoxide detectors.  Christie Beale B. Ovadia Lopp DO [facility] [address], [address] [place], [place] [address] Phone: [phone]  

## 2015-07-21 LAB — COMPLETE METABOLIC PANEL WITH GFR
ALBUMIN: 4.3 g/dL (ref 3.6–5.1)
ALK PHOS: 49 U/L (ref 33–130)
ALT: 16 U/L (ref 6–29)
AST: 25 U/L (ref 10–35)
BUN: 13 mg/dL (ref 7–25)
CHLORIDE: 97 mmol/L — AB (ref 98–110)
CO2: 31 mmol/L (ref 20–31)
Calcium: 10.2 mg/dL (ref 8.6–10.4)
Creat: 0.91 mg/dL (ref 0.50–1.05)
GFR, EST NON AFRICAN AMERICAN: 69 mL/min (ref >=60)
GFR, Est African American: 80 mL/min (ref >=60)
GLUCOSE: 81 mg/dL (ref 65–99)
POTASSIUM: 3.5 mmol/L (ref 3.5–5.3)
SODIUM: 139 mmol/L (ref 135–146)
Total Bilirubin: 1.3 mg/dL — ABNORMAL HIGH (ref 0.2–1.2)
Total Protein: 6.8 g/dL (ref 6.1–8.1)

## 2015-07-21 LAB — LIPID PANEL
CHOL/HDL RATIO: 4.1 ratio (ref <=5.0)
CHOLESTEROL: 236 mg/dL — AB (ref 125–200)
HDL: 58 mg/dL (ref >=46)
LDL CALC: 144 mg/dL — AB (ref <130)
Triglycerides: 171 mg/dL — ABNORMAL HIGH (ref <150)
VLDL: 34 mg/dL — AB (ref <30)

## 2015-09-27 ENCOUNTER — Encounter: Payer: Self-pay | Admitting: Family Medicine

## 2015-09-28 MED ORDER — ZOSTER VACCINE LIVE 19400 UNT/0.65ML ~~LOC~~ SOLR: 0.6500 mL | Freq: Once | SUBCUTANEOUS | Status: AC

## 2015-10-04 ENCOUNTER — Encounter: Payer: Self-pay | Admitting: Family Medicine

## 2015-10-04 MED ORDER — FLUTICASONE-SALMETEROL 100-50 MCG/DOSE IN AEPB: 1.0000 | INHALATION_SPRAY | Freq: Two times a day (BID) | RESPIRATORY_TRACT | Status: AC

## 2016-03-20 ENCOUNTER — Other Ambulatory Visit: Payer: Self-pay | Admitting: Family Medicine

## 2016-04-02 ENCOUNTER — Other Ambulatory Visit: Payer: Self-pay | Admitting: Physician Assistant

## 2016-04-02 DIAGNOSIS — Z1231 Encounter for screening mammogram for malignant neoplasm of breast: Secondary | ICD-10-CM

## 2016-04-11 ENCOUNTER — Ambulatory Visit (INDEPENDENT_AMBULATORY_CARE_PROVIDER_SITE_OTHER): Payer: BC Managed Care – PPO

## 2016-04-11 DIAGNOSIS — Z1231 Encounter for screening mammogram for malignant neoplasm of breast: Secondary | ICD-10-CM

## 2016-07-19 ENCOUNTER — Other Ambulatory Visit: Payer: Self-pay

## 2016-07-19 DIAGNOSIS — Z130 Encounter for screening for diseases of the blood and blood-forming organs and certain disorders involving the immune mechanism: Secondary | ICD-10-CM

## 2016-07-19 DIAGNOSIS — M8580 Other specified disorders of bone density and structure, unspecified site: Secondary | ICD-10-CM

## 2016-07-19 DIAGNOSIS — Z1329 Encounter for screening for other suspected endocrine disorder: Secondary | ICD-10-CM

## 2016-07-19 DIAGNOSIS — E7849 Other hyperlipidemia: Secondary | ICD-10-CM

## 2016-07-19 DIAGNOSIS — I10 Essential (primary) hypertension: Secondary | ICD-10-CM

## 2016-07-19 DIAGNOSIS — Z0000 Encounter for general adult medical examination without abnormal findings: Secondary | ICD-10-CM

## 2016-07-19 LAB — COMPLETE METABOLIC PANEL WITH GFR
ALT: 12 U/L (ref 6–29)
AST: 21 U/L (ref 10–35)
Albumin: 4.5 g/dL (ref 3.6–5.1)
Alkaline Phosphatase: 46 U/L (ref 33–130)
BUN: 13 mg/dL (ref 7–25)
CHLORIDE: 98 mmol/L (ref 98–110)
CO2: 25 mmol/L (ref 20–31)
CREATININE: 1.05 mg/dL — AB (ref 0.50–0.99)
Calcium: 9.8 mg/dL (ref 8.6–10.4)
GFR, Est African American: 67 mL/min (ref >=60)
GFR, Est Non African American: 58 mL/min — ABNORMAL LOW (ref >=60)
Glucose, Bld: 90 mg/dL (ref 65–99)
Potassium: 3.3 mmol/L — ABNORMAL LOW (ref 3.5–5.3)
SODIUM: 136 mmol/L (ref 135–146)
Total Bilirubin: 1.3 mg/dL — ABNORMAL HIGH (ref 0.2–1.2)
Total Protein: 6.9 g/dL (ref 6.1–8.1)

## 2016-07-19 LAB — CBC WITH DIFFERENTIAL/PLATELET
BASOS PCT: 1 %
Basophils Absolute: 38 cells/uL (ref 0–200)
EOS ABS: 76 {cells}/uL (ref 15–500)
Eosinophils Relative: 2 %
HEMATOCRIT: 39.7 % (ref 35.0–45.0)
Hemoglobin: 13.9 g/dL (ref 11.7–15.5)
LYMPHS PCT: 28 %
Lymphs Abs: 1064 cells/uL (ref 850–3900)
MCH: 29.5 pg (ref 27.0–33.0)
MCHC: 35 g/dL (ref 32.0–36.0)
MCV: 84.3 fL (ref 80.0–100.0)
MONO ABS: 456 {cells}/uL (ref 200–950)
MPV: 10 fL (ref 7.5–12.5)
Monocytes Relative: 12 %
NEUTROS PCT: 57 %
Neutro Abs: 2166 cells/uL (ref 1500–7800)
Platelets: 259 10*3/uL (ref 140–400)
RBC: 4.71 MIL/uL (ref 3.80–5.10)
RDW: 13.2 % (ref 11.0–15.0)
WBC: 3.8 10*3/uL (ref 3.8–10.8)

## 2016-07-19 LAB — LIPID PANEL
Cholesterol: 257 mg/dL — ABNORMAL HIGH (ref <200)
HDL: 70 mg/dL (ref >50)
LDL CALC: 161 mg/dL — AB
Total CHOL/HDL Ratio: 3.7 Ratio (ref <5.0)
Triglycerides: 128 mg/dL (ref <150)
VLDL: 26 mg/dL (ref <30)

## 2016-07-19 LAB — TSH: TSH: 5.85 m[IU]/L — AB

## 2016-07-20 LAB — VITAMIN D 25 HYDROXY (VIT D DEFICIENCY, FRACTURES): Vit D, 25-Hydroxy: 20 ng/mL — ABNORMAL LOW (ref 30–100)

## 2016-07-23 ENCOUNTER — Ambulatory Visit (INDEPENDENT_AMBULATORY_CARE_PROVIDER_SITE_OTHER): Payer: BC Managed Care – PPO | Admitting: Physician Assistant

## 2016-07-23 ENCOUNTER — Other Ambulatory Visit: Payer: Self-pay | Admitting: *Deleted

## 2016-07-23 ENCOUNTER — Encounter: Payer: Self-pay | Admitting: Physician Assistant

## 2016-07-23 VITALS — BP 130/74 | HR 68 | Ht 63.75 in | Wt 137.0 lb

## 2016-07-23 DIAGNOSIS — E876 Hypokalemia: Secondary | ICD-10-CM | POA: Diagnosis not present

## 2016-07-23 DIAGNOSIS — Z0000 Encounter for general adult medical examination without abnormal findings: Secondary | ICD-10-CM

## 2016-07-23 DIAGNOSIS — E039 Hypothyroidism, unspecified: Secondary | ICD-10-CM

## 2016-07-23 DIAGNOSIS — R7989 Other specified abnormal findings of blood chemistry: Secondary | ICD-10-CM

## 2016-07-23 DIAGNOSIS — I10 Essential (primary) hypertension: Secondary | ICD-10-CM

## 2016-07-23 MED ORDER — CHLORTHALIDONE 50 MG PO TABS: 50.0000 mg | ORAL_TABLET | Freq: Every day | ORAL | 3 refills | Status: DC

## 2016-07-23 MED ORDER — LEVOTHYROXINE SODIUM 25 MCG PO TABS: 25.0000 ug | ORAL_TABLET | Freq: Every day | ORAL | 1 refills | Status: DC

## 2016-07-23 MED ORDER — CHLORTHALIDONE 50 MG PO TABS: 50.0000 mg | ORAL_TABLET | Freq: Every day | ORAL | 3 refills | Status: AC

## 2016-07-23 NOTE — Progress Notes (Signed)
Subjective:     Christie Lopez is a 60 y.o. female and is here for a comprehensive physical exam. The patient reports no problems.  Social History   Social History  . Marital status: Married    Spouse name: N/A  . Number of children: N/A  . Years of education: N/A   Occupational History  . Not on file.   Social History Main Topics  . Smoking status: Never Smoker  . Smokeless tobacco: Not on file  . Alcohol use 1.0 oz/week    2 drink(s) per week  . Drug use: No  . Sexual activity: Yes   Other Topics Concern  . Not on file   Social History Narrative  . No narrative on file   Health Maintenance  Topic Date Due  . Hepatitis C Screening  11-23-1955  . HIV Screening  02/25/1971  . ZOSTAVAX  02/25/2016  . PAP SMEAR  07/02/2016  . INFLUENZA VACCINE  07/23/2017 (Originally 04/10/2016)  . MAMMOGRAM  04/11/2018  . TETANUS/TDAP  03/20/2022  . COLONOSCOPY  05/11/2025    The following portions of the patient's history were reviewed and updated as appropriate: allergies, current medications, past family history, past medical history, past social history, past surgical history and problem list.  Review of Systems A comprehensive review of systems was negative.   Objective:    BP 130/74   Pulse 68   Ht 5' 3.75" (1.619 m)   Wt 137 lb (62.1 kg)   BMI 23.70 kg/m  General appearance: alert, cooperative and appears stated age Head: Normocephalic, without obvious abnormality, atraumatic Eyes: conjunctivae/corneas clear. PERRL, EOM's intact. Fundi benign. Ears: normal TM's and external ear canals both ears Nose: Nares normal. Septum midline. Mucosa normal. No drainage or sinus tenderness. Throat: lips, mucosa, and tongue normal; teeth and gums normal Neck: no adenopathy, no carotid bruit, no JVD, supple, symmetrical, trachea midline and thyroid not enlarged, symmetric, no tenderness/mass/nodules Back: symmetric, no curvature. ROM normal. No CVA tenderness. Lungs: clear to  auscultation bilaterally Heart: regular rate and rhythm, S1, S2 normal, no murmur, click, rub or gallop Abdomen: soft, non-tender; bowel sounds normal; no masses,  no organomegaly Extremities: extremities normal, atraumatic, no cyanosis or edema Pulses: 2+ and symmetric Skin: Skin color, texture, turgor normal. No rashes or lesions or multiple sebhorreic keratosis all over body.  Lymph nodes: Cervical, supraclavicular, and axillary nodes normal. Neurologic: Alert and oriented X 3, normal strength and tone. Normal symmetric reflexes. Normal coordination and gait    Assessment:    Healthy female exam.     Plan:    Marland Kitchen.Marland Kitchen.Christie Lopez was seen today for annual exam.  Diagnoses and all orders for this visit:  Routine physical examination  Elevated serum creatinine -     BASIC METABOLIC PANEL WITH GFR  Hypokalemia -     BASIC METABOLIC PANEL WITH GFR  Hypothyroidism, unspecified type -     levothyroxine (SYNTHROID, LEVOTHROID) 25 MCG tablet; Take 1 tablet (25 mcg total) by mouth daily before breakfast. -     TSH  Essential hypertension, benign -     chlorthalidone (HYGROTON) 50 MG tablet; Take 1 tablet (50 mg total) by mouth daily.   Discussed labs in office today.  CV 10 year risk 4.7 percent. Will change up diet and encouraged exercise and recheck in 6 months. Pt declined statin and red yeast rice therapy.  Colonoscopy up to date.  Mammogram up to date.   Osteopenia/vitamin D insuffiency- discussed vitamin D 2000 units daily with  calcium 1500mg  or 4 servings of diary.   Will start levothyroxine. Recheck in 4-6 weeks.  See After Visit Summary for Counseling Recommendations

## 2016-07-23 NOTE — Patient Instructions (Signed)

## 2016-08-23 LAB — BASIC METABOLIC PANEL WITH GFR
BUN: 14 mg/dL (ref 7–25)
CALCIUM: 10 mg/dL (ref 8.6–10.4)
CO2: 30 mmol/L (ref 20–31)
CREATININE: 0.96 mg/dL (ref 0.50–0.99)
Chloride: 99 mmol/L (ref 98–110)
GFR, EST AFRICAN AMERICAN: 74 mL/min (ref >=60)
GFR, EST NON AFRICAN AMERICAN: 64 mL/min (ref >=60)
Glucose, Bld: 70 mg/dL (ref 65–99)
Potassium: 3.6 mmol/L (ref 3.5–5.3)
SODIUM: 136 mmol/L (ref 135–146)

## 2016-08-23 LAB — TSH: TSH: 4.15 m[IU]/L

## 2016-08-27 ENCOUNTER — Other Ambulatory Visit: Payer: Self-pay | Admitting: Physician Assistant

## 2016-08-27 MED ORDER — LEVOTHYROXINE SODIUM 50 MCG PO TABS: 50.0000 ug | ORAL_TABLET | Freq: Every day | ORAL | 2 refills | Status: AC

## 2016-08-27 NOTE — Progress Notes (Signed)
Sent to pharmacy 

## 2016-11-21 ENCOUNTER — Encounter: Payer: Self-pay | Admitting: Physician Assistant

## 2016-12-18 ENCOUNTER — Encounter: Payer: Self-pay | Admitting: Physician Assistant

## 2016-12-19 NOTE — Telephone Encounter (Signed)
Call pt:  I reviewed BP log. You are right BP's are good to low at times. You could cut your cholorthalidone in half and see if help with low readings. When low consider hydration and add a little salt to diet to get up. Overall looks great.   Will scan in BP's taken at home.  Ranging from 130's over 70's to 90's over 60's.

## 2017-03-21 ENCOUNTER — Other Ambulatory Visit: Payer: Self-pay | Admitting: Physician Assistant

## 2017-03-21 DIAGNOSIS — Z1231 Encounter for screening mammogram for malignant neoplasm of breast: Secondary | ICD-10-CM

## 2017-04-16 ENCOUNTER — Ambulatory Visit: Payer: BC Managed Care – PPO

## 2017-04-19 ENCOUNTER — Ambulatory Visit (INDEPENDENT_AMBULATORY_CARE_PROVIDER_SITE_OTHER): Payer: BC Managed Care – PPO

## 2017-04-19 DIAGNOSIS — Z1231 Encounter for screening mammogram for malignant neoplasm of breast: Secondary | ICD-10-CM | POA: Diagnosis not present

## 2017-04-22 NOTE — Progress Notes (Signed)
Call pt: normal mammogram. Follow up in one year.

## 2018-04-01 ENCOUNTER — Other Ambulatory Visit: Payer: Self-pay | Admitting: Nurse Practitioner

## 2018-04-01 DIAGNOSIS — Z1239 Encounter for other screening for malignant neoplasm of breast: Secondary | ICD-10-CM

## 2018-04-25 ENCOUNTER — Ambulatory Visit (INDEPENDENT_AMBULATORY_CARE_PROVIDER_SITE_OTHER): Payer: BC Managed Care – PPO

## 2018-04-25 DIAGNOSIS — Z1239 Encounter for other screening for malignant neoplasm of breast: Secondary | ICD-10-CM

## 2018-04-25 DIAGNOSIS — Z1231 Encounter for screening mammogram for malignant neoplasm of breast: Secondary | ICD-10-CM | POA: Diagnosis not present

## 2019-05-26 ENCOUNTER — Other Ambulatory Visit: Payer: Self-pay | Admitting: Nurse Practitioner

## 2019-05-26 DIAGNOSIS — Z1231 Encounter for screening mammogram for malignant neoplasm of breast: Secondary | ICD-10-CM

## 2019-08-05 ENCOUNTER — Other Ambulatory Visit: Payer: Self-pay

## 2019-08-05 ENCOUNTER — Ambulatory Visit (INDEPENDENT_AMBULATORY_CARE_PROVIDER_SITE_OTHER): Payer: BC Managed Care – PPO

## 2019-08-05 DIAGNOSIS — Z1231 Encounter for screening mammogram for malignant neoplasm of breast: Secondary | ICD-10-CM

## 2020-07-04 ENCOUNTER — Other Ambulatory Visit: Payer: Self-pay | Admitting: Nurse Practitioner

## 2020-07-04 DIAGNOSIS — Z1231 Encounter for screening mammogram for malignant neoplasm of breast: Secondary | ICD-10-CM

## 2020-08-24 ENCOUNTER — Ambulatory Visit: Payer: BC Managed Care – PPO

## 2020-08-31 ENCOUNTER — Ambulatory Visit: Payer: BC Managed Care – PPO

## 2021-05-02 IMAGING — MG DIGITAL SCREENING BILAT W/ TOMO W/ CAD
6 of 10 series · 6 of 30 positions shown · non-contrast
Comparison: Previous exam(s).

CLINICAL DATA: Screening.

EXAM:
DIGITAL SCREENING BILATERAL MAMMOGRAM WITH TOMO AND CAD

[R CC synth-2D]
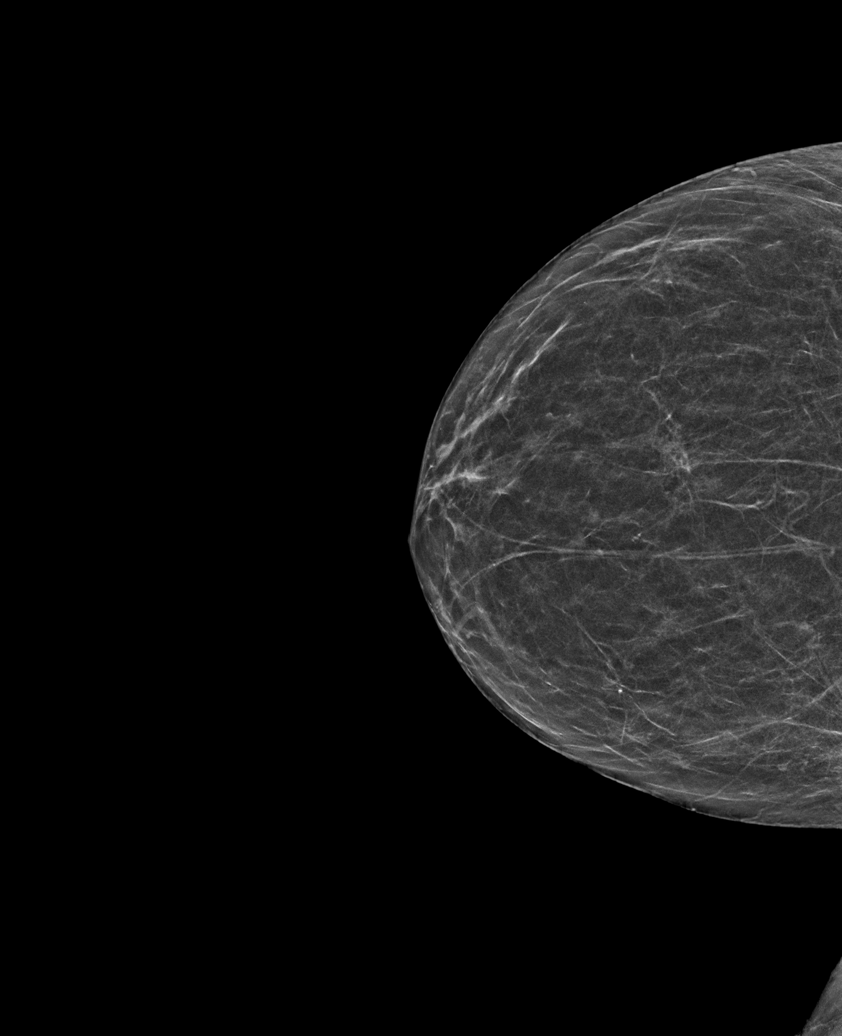

[L MLO synth-2D]
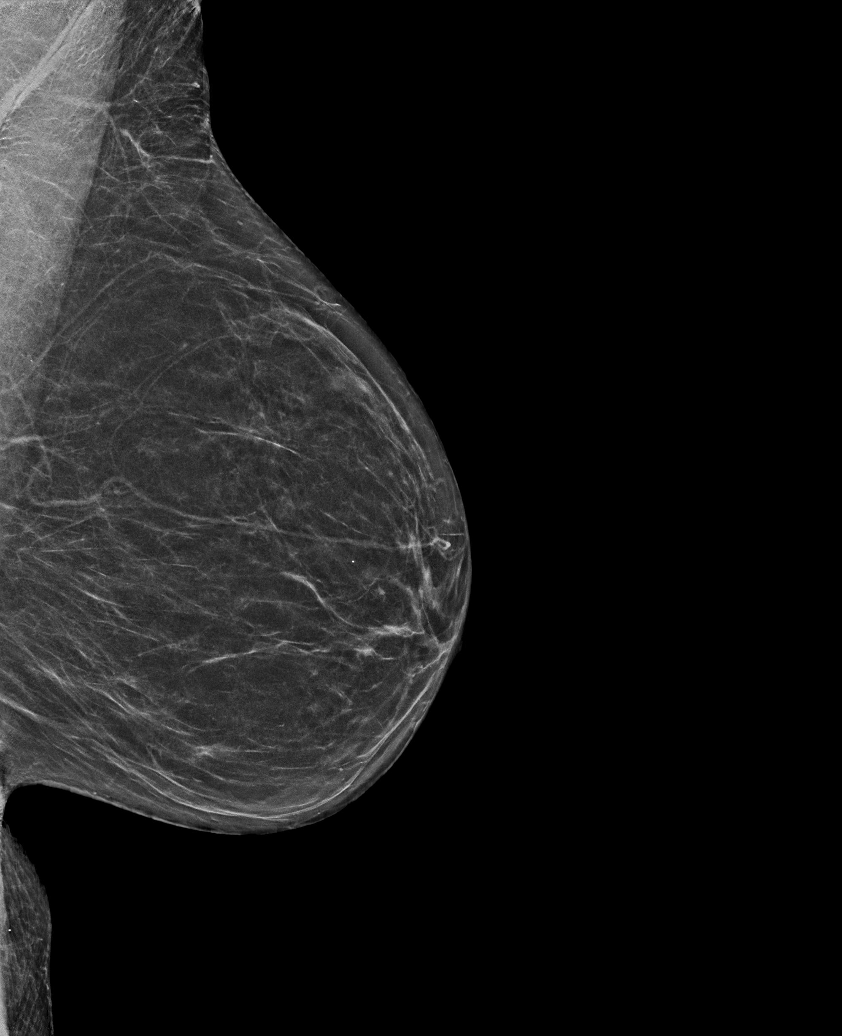

[L CC synth-2D]
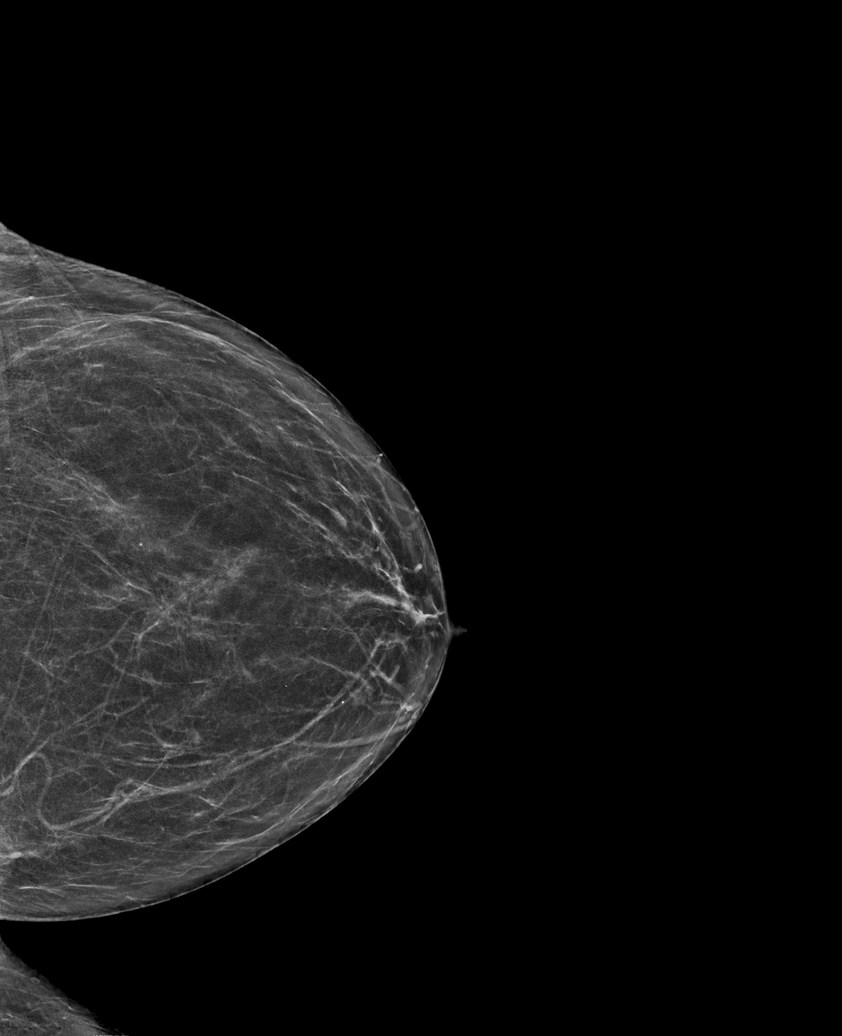

[R MLO synth-2D]
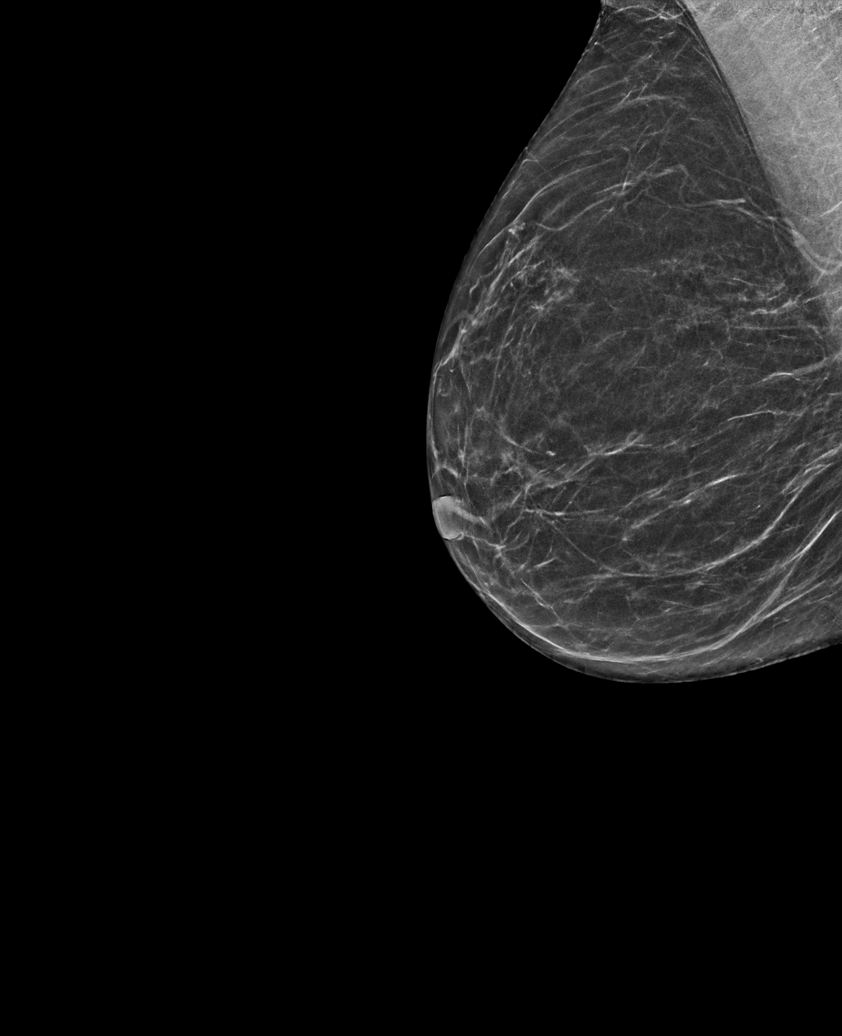

[L XCCL synth-2D]
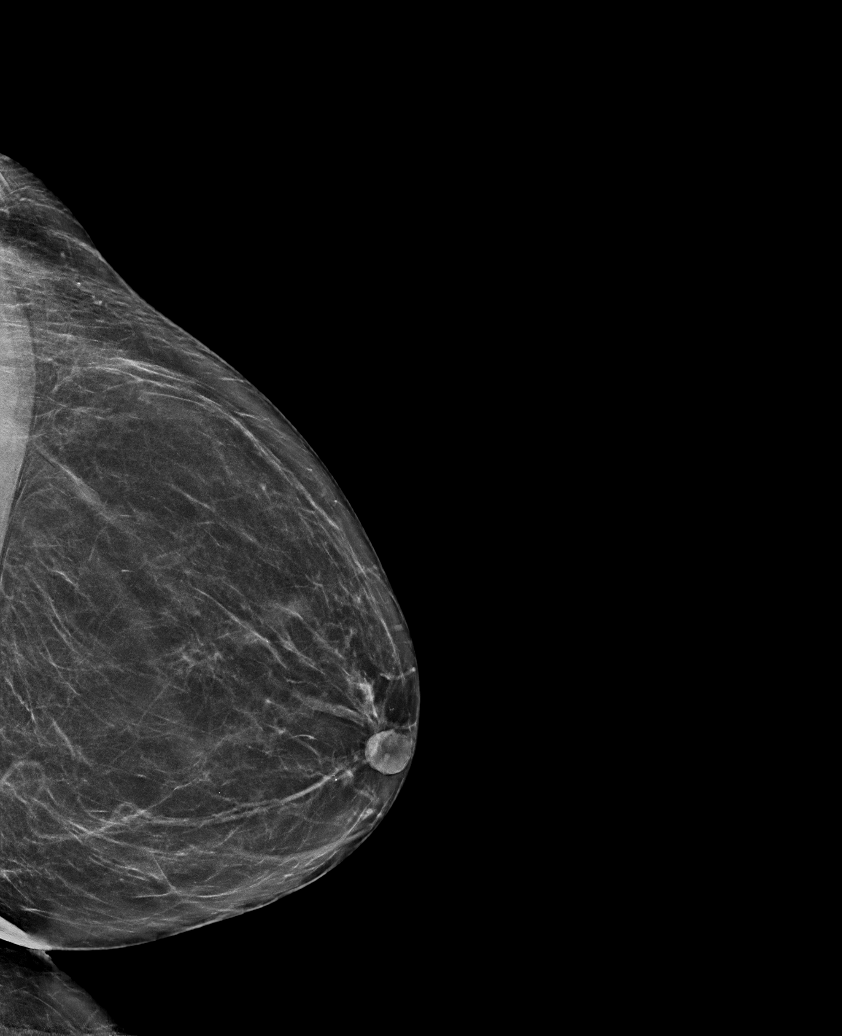

[R MLO tomo · tomo slice 27/54.0]
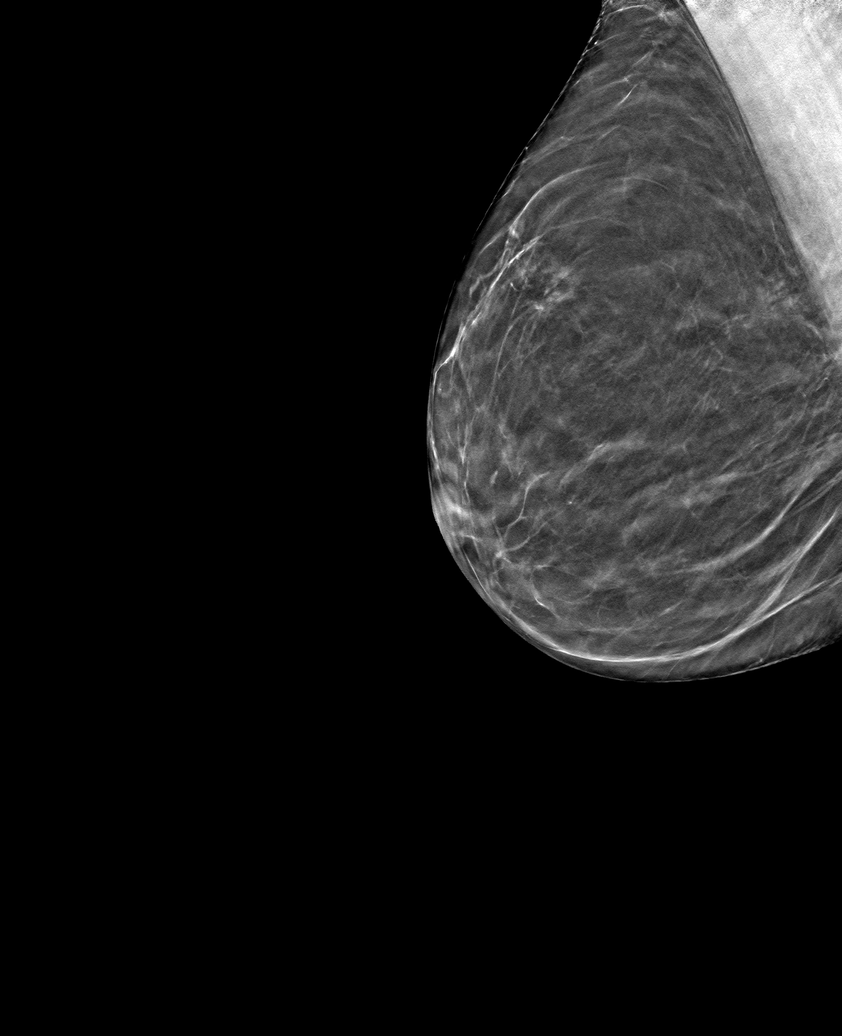

[6 of 30 positions shown; findings below may reference images not displayed]

ACR Breast Density Category b: There are scattered areas of
fibroglandular density.
FINDINGS: There are no findings suspicious for malignancy. Images were
processed with CAD.
IMPRESSION: No mammographic evidence of malignancy. A result letter of this
screening mammogram will be mailed directly to the patient.

RECOMMENDATION:
Screening mammogram in one year. (Code:[id number])

BI-RADS CATEGORY  1: Negative.
